# Patient Record
Sex: Male | Born: 1995 | Hispanic: No | Marital: Single | Smoking: Never smoker
Health system: Southern US, Community
[De-identification: ages and names within clinical notes are randomized; demographics above are authoritative.]

## PROBLEM LIST (undated history)

## (undated) DIAGNOSIS — H902 Conductive hearing loss, unspecified: Secondary | ICD-10-CM

## (undated) DIAGNOSIS — F845 Asperger's syndrome: Secondary | ICD-10-CM

## (undated) DIAGNOSIS — F840 Autistic disorder: Secondary | ICD-10-CM

## (undated) HISTORY — DX: Conductive hearing loss, unspecified: H90.2

## (undated) HISTORY — DX: Asperger's syndrome: F84.5

## (undated) HISTORY — DX: Autistic disorder: F84.0

---
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
## 2017-04-01 ENCOUNTER — Other Ambulatory Visit: Payer: Self-pay | Admitting: Family

## 2017-04-01 DIAGNOSIS — F902 Attention-deficit hyperactivity disorder, combined type: Secondary | ICD-10-CM

## 2017-04-01 DIAGNOSIS — F951 Chronic motor or vocal tic disorder: Secondary | ICD-10-CM

## 2017-04-04 ENCOUNTER — Telehealth (INDEPENDENT_AMBULATORY_CARE_PROVIDER_SITE_OTHER): Payer: Self-pay | Admitting: Neurology

## 2017-04-04 NOTE — Telephone Encounter (Signed)
Called patient's mother and left her a voicemail asking her to please return my call for appt scheduling as [name] is overdue to come see us. I sent in a prescription for cloNIDINE for this patient with no refills.

## 2017-04-12 NOTE — Telephone Encounter (Signed)
Patient scheduled for 04/27/2017 at 815am

## 2017-04-27 ENCOUNTER — Ambulatory Visit (INDEPENDENT_AMBULATORY_CARE_PROVIDER_SITE_OTHER): Payer: Self-pay | Admitting: Neurology

## 2017-04-28 NOTE — Progress Notes (Deleted)
[facility]: [name] MRN: [id number] Sex: male DOB: 02/27/1996  Provider: Keturah Shaverseza Nabizadeh, MD [facility]  Note type: Routine return visit  Referral Source: Dr. Vaughan BastaSummer History from: {CN REFERRED OZ:[id number]}BY:[id number]} Chief Complaint: follow up on Tics, Asperger's, ADHD  History of Present Illness:  Adam Drownelson [name] Hauter Jr. is a 21 y.o. male ***.  Review of Systems: 12 system review as per HPI, otherwise negative.  Past Medical History:  Diagnosis Date  . Asperger's disorder 10/21/10  . Autism   . Unspecified conductive hearing loss 12/08/10   Hospitalizations: {yes no:314532}, Head Injury: {yes no:314532}, Nervous System Infections: {yes no:314532}, Immunizations up to date: {yes no:314532}  Birth History ***  Surgical History Past Surgical History:  Procedure Laterality Date  . TOENAIL EXCISION  2013  . TONSILLECTOMY  2003    Family History family history includes ADD / ADHD in his brother and sister; Diabetes in his father, maternal grandfather, maternal grandmother, mother, paternal aunt, paternal aunt, and paternal grandfather; Luiz BlareGraves' disease in his mother; Hyperlipidemia in his maternal grandfather and mother; Migraines in his mother; Other in his cousin. Family History is negative for ***.  Social History Social History   Social History  . Marital status: Single    Spouse name: N/A  . Number of children: N/A  . Years of education: N/A   Social History Main Topics  . Smoking status: Never Smoker  . Smokeless tobacco: Never Used  . Alcohol use No  . Drug use: No  . Sexual activity: No   Other Topics Concern  . Not on file   Social History Narrative   Adam Robbins is in 512 th grade at [employer]. He is doing well.    Living with both parents and brother.   Educational level {Misc; education levels:33222} School Attending: *** {school level:210120006} school. Occupation: Consulting civil engineertudent *** Living with  {companion:315061}  School comments ***  The medication list was reviewed and reconciled. All changes or newly prescribed medications were explained.  A complete medication list was provided to the patient/caregiver.  Allergies  Allergen Reactions  . Other     Seasonal    Physical Exam There were no vitals taken for this visit. ***  Assessment and Plan ***  No orders of the defined types were placed in this encounter.  No orders of the defined types were placed in this encounter.

## 2017-04-29 ENCOUNTER — Encounter (INDEPENDENT_AMBULATORY_CARE_PROVIDER_SITE_OTHER): Payer: Self-pay | Admitting: Neurology

## 2017-04-29 ENCOUNTER — Ambulatory Visit (INDEPENDENT_AMBULATORY_CARE_PROVIDER_SITE_OTHER): Payer: Medicaid Other | Admitting: Neurology

## 2017-04-29 VITALS — BP 108/76 | HR 104 | Ht 67.5 in | Wt 199.6 lb

## 2017-04-29 DIAGNOSIS — F845 Asperger's syndrome: Secondary | ICD-10-CM

## 2017-04-29 DIAGNOSIS — F902 Attention-deficit hyperactivity disorder, combined type: Secondary | ICD-10-CM | POA: Diagnosis not present

## 2017-04-29 DIAGNOSIS — R4587 Impulsiveness: Secondary | ICD-10-CM | POA: Diagnosis not present

## 2017-04-29 DIAGNOSIS — F951 Chronic motor or vocal tic disorder: Secondary | ICD-10-CM | POA: Diagnosis not present

## 2017-04-29 MED ORDER — CLONIDINE HCL 0.1 MG PO TABS: 0.1000 mg | ORAL_TABLET | Freq: Two times a day (BID) | ORAL | 2 refills | Status: DC

## 2017-04-29 NOTE — Progress Notes (Signed)
Patient: Adam Robbins. MRN: [id number] Sex: male DOB: 12/30/1995  Provider: Keturah Shavers, MD Location of Care: [facility]  Note type: Routine return visit  Referral Source: Ronney Asters, MD History from: mother, patient and [facility] chart Chief Complaint: Asperger's disorder, Tics, ADHD  History of Present Illness: Adam Robbins. is a 21 y.o. male is here for follow-up management of tic disorder as well as behavioral issues. He has diagnosis of Asperger disease, ADHD and simple motor and vocal tic disorder for which she has been on moderate dose of clonidine with significant improvement of his symptoms and currently as per patient and his mother he has been having just a couple of episodes of tic movements on a daily basis. As per mother he has been having some anxiety issues as well as occasional aggressive behavior, impulsivity and anger issues for which he was on therapy in the past but he hasn't been on therapy for the past 6 months and currently he does not have any psychologist to go to. He usually sleeps well through the night without any awakening. He is doing well otherwise and has no other complaints or concerns. Mother is happy with his progress with his tic disorder.  Review of Systems: 12 system review as per HPI, otherwise negative.  Past Medical History:  Diagnosis Date  . Asperger's disorder 10/21/10  . Autism   . Unspecified conductive hearing loss 12/08/10   Hospitalizations: No., Head Injury: No., Nervous System Infections: No., Immunizations up to date: Yes.     Surgical History Past Surgical History:  Procedure Laterality Date  . TOENAIL EXCISION  2013  . TONSILLECTOMY  2003    Family History family history includes ADD / ADHD in his brother and sister; Diabetes in his father, maternal grandfather, maternal grandmother, mother, paternal aunt, paternal aunt, and paternal grandfather; Luiz Blare' disease in his mother;  Hyperlipidemia in his maternal grandfather and mother; Migraines in his mother; Other in his cousin.   Social History Social History   Social History  . Marital status: Single    Spouse name: N/A  . Number of children: N/A  . Years of education: N/A   Social History Main Topics  . Smoking status: Never Smoker  . Smokeless tobacco: Never Used  . Alcohol use No  . Drug use: No  . Sexual activity: No   Other Topics Concern  . None   Social History Narrative   Horace is in 12 th grade at [employer]. He is doing well.    Living with both parents and brother.    The medication list was reviewed and reconciled. All changes or newly prescribed medications were explained.  A complete medication list was provided to the patient/caregiver.  Allergies  Allergen Reactions  . Other     Seasonal    Physical Exam BP 108/76   Pulse (!) 104   Ht 5' 7.5" (1.715 m)   Wt 199 lb 9.6 oz (90.5 kg)   BMI 30.80 kg/m  LOV:FIEPP, alert, not in distress Skin:No rash, No neurocutaneous stigmata. HEENT:Normocephalic, no conjunctival injection,  mucous membranes moist, oropharynx clear. Neck:Supple, no meningismus. No focal tenderness. Resp: Clear to auscultation bilaterally IR:JJOACZY rate, normal S1/S2, no murmurs,  Abd: abdomen soft, non-tender, non-distended. No hepatosplenomegaly or mass SAY:TKZS and well-perfused. no muscle wasting,   Neurological Examination: WF:UXNAT, alert, Moderate decrease in eye contact, answered the questions appropriately, speech was fluent, Normal comprehension.  Cranial Nerves:Pupils were equal and reactive  to light ( 5-223mm); normal fundoscopic exam with sharp discs, visual field full with confrontation test; EOM normal, no nystagmus; no ptsosis, no double vision, intact facial sensation, face symmetric with full strength of facial muscles, palate elevation is symmetric, tongue protrusion is symmetric with full movement to both  sides. Sternocleidomastoid and trapezius are with normal strength. Tone-Normal Strength-Normal strength in all muscle groups DTRs-  Biceps Triceps Brachioradialis Patellar Ankle  R 2+ 2+ 2+ 2+ 2+  L 2+ 2+ 2+ 2+ 2+   Plantar responses flexor bilaterally, no clonus noted Sensation:Intact to light touch, Romberg negative. Coordination:No dysmetria on FTN test. No difficulty with balance. Gait:Normal walk and run.     Assessment and Plan 1. Chronic motor or vocal tic disorder   2. Asperger's disorder   3. ADHD (attention deficit hyperactivity disorder), combined type   4. Impulsiveness    This is an 21 year old male with history of Asperger disorder and ADHD as well as chronic motor and vocal tic disorder with significant improvement on moderate dose of clonidine. He is also having anxiety issues as well as aggressive and impulsive behavior and anger outbursts for which she was on therapy in the past but not in the past 6 months. He has no focal findings on his neurological examination. Discussed with mother that at this time I would recommended to continue the same dose of clonidine although if he continues with no frequent motor or vocal tic episodes, he may gradually decreased the dose of medication to 1 tablet every night and see how he does although if he develops more tic episodes, he may need to go back to the previous dose of medication. In terms of his aggressive and impulsive behavior, I think he might need to have some behavioral therapy to work on relaxation techniques so I would recommended to see our behavioral clinician a few sessions to work on these issues. I would like to see him in 6-8 months for follow-up visit or sooner if he develops any more symptoms or any new concerns. He and his mother understood and agreed with the plan.   Meds ordered this encounter  Medications  . cloNIDine (CATAPRES) 0.1 MG tablet    Sig: Take 1 tablet (0.1 mg total)  by mouth 2 (two) times daily.    Dispense:  180 tablet    Refill:  2    Please have mother call to schedule an appt with our office. [phone]   Orders Placed This Encounter  Procedures  . Amb ref to Integrated Behavioral [facility]    Referral Priority:   Routine    Referral Type:   Consultation    Referral Reason:   Specialty Services Required    Number of Visits Requested:   1

## 2017-05-04 DIAGNOSIS — Z0279 Encounter for issue of other medical certificate: Secondary | ICD-10-CM

## 2017-05-13 ENCOUNTER — Institutional Professional Consult (permissible substitution) (INDEPENDENT_AMBULATORY_CARE_PROVIDER_SITE_OTHER): Payer: Medicaid Other | Admitting: Licensed Clinical Social Worker

## 2017-08-05 ENCOUNTER — Encounter: Payer: Self-pay | Admitting: Podiatry

## 2017-08-05 ENCOUNTER — Ambulatory Visit (INDEPENDENT_AMBULATORY_CARE_PROVIDER_SITE_OTHER): Payer: Medicaid Other | Admitting: Podiatry

## 2017-08-05 VITALS — BP 119/68 | HR 76 | Resp 16

## 2017-08-05 DIAGNOSIS — L309 Dermatitis, unspecified: Secondary | ICD-10-CM | POA: Diagnosis not present

## 2017-08-05 NOTE — Progress Notes (Signed)
Subjective:    Patient ID: Adam Driver Leandrew Koyanagi., male   DOB: 21 y.o.   MRN: [id number]   HPI patient presents with crusted tissue formation plantar aspect left that he at times cut Himself    Review of Systems  All other systems reviewed and are negative.       Objective:  Physical Exam  Constitutional: He appears well-developed and well-nourished.  Cardiovascular: Intact distal pulses.   Musculoskeletal: Normal range of motion.  Neurological: He is alert.  Skin: Skin is warm and dry.  Nursing note and vitals reviewed.  Neurovascular status found to be intact muscle strength was adequate with patient noted to have some area of crusted tissue plantar aspect left foot that's localized in nature and not currently tender. Has good digital perfusion well oriented    Assessment:   Most likely is some form of skin condition with no indications that there is bone pathology      Plan:    Reviewed creams that would be best and did H&P and explained treatment options

## 2017-08-05 NOTE — Progress Notes (Signed)
   Subjective:    Patient ID: Adam Drown., male    DOB: 04-01-1996, 21 y.o.   MRN: [id number]  HPI Chief Complaint  Patient presents with  . Skin condition    Left foot; plantar forefoot; pt stated, "Has had this for the past 2 years; has used lotion on skin to try and help with dry skin"      Review of Systems  All other systems reviewed and are negative.      Objective:   Physical Exam        Assessment & Plan:

## 2017-12-09 ENCOUNTER — Encounter (INDEPENDENT_AMBULATORY_CARE_PROVIDER_SITE_OTHER): Payer: Self-pay | Admitting: Neurology

## 2017-12-09 ENCOUNTER — Ambulatory Visit (INDEPENDENT_AMBULATORY_CARE_PROVIDER_SITE_OTHER): Payer: Medicaid Other | Admitting: Neurology

## 2017-12-09 VITALS — BP 110/74 | HR 80 | Ht 67.0 in | Wt 210.4 lb

## 2017-12-09 DIAGNOSIS — F951 Chronic motor or vocal tic disorder: Secondary | ICD-10-CM | POA: Diagnosis not present

## 2017-12-09 DIAGNOSIS — F845 Asperger's syndrome: Secondary | ICD-10-CM | POA: Diagnosis not present

## 2017-12-09 DIAGNOSIS — F902 Attention-deficit hyperactivity disorder, combined type: Secondary | ICD-10-CM | POA: Diagnosis not present

## 2017-12-09 MED ORDER — GUANFACINE HCL ER 2 MG PO TB24: 2.0000 mg | ORAL_TABLET | Freq: Every day | ORAL | 5 refills | Status: DC

## 2017-12-09 NOTE — Progress Notes (Signed)
Patient: Adam Robbins. MRN: [id number] Sex: male DOB: Oct 09, 1996  Provider: Keturah Shavers, MD Location of Care: [facility]  Note type: Routine return visit  Referral Source: Dr. Victorino Dike Summer  History from: patient, [facility] chart and Aunt Chief Complaint: Asperger's, Tics, ADHD  History of Present Illness: Adam Robbins. is a 22 y.o. male is here for follow-up management of tic disorder.  He has been having episodes of motor and vocal tics, most of the time simple but occasionally complex vocal tics for which he has been on clonidine with fairly good symptoms control.  On his last visit in June he was taking 0.1 mg clonidine twice daily and since he was not having frequent symptoms, it was recommended that he might decrease the dose to 1 tablet daily if he continues to be symptom-free. He was doing fairly well for a few months but over the past month he started having more frequent motor and vocal tics which including head turning to the side, making noises and clearing throat and occasionally saying different words loudly.  Currently he is taking 0.1 mg clonidine every night And he does not take the medicine in the morning since he would be more sleepy during the day.  He is not on any other medication at this point.  Review of Systems: 12 system review as per HPI, otherwise negative.  Past Medical History:  Diagnosis Date  . Asperger's disorder 10/21/10  . Autism   . Unspecified conductive hearing loss 12/08/10   Hospitalizations: No., Head Injury: No., Nervous System Infections: No., Immunizations up to date: Yes.    Surgical History Past Surgical History:  Procedure Laterality Date  . TOENAIL EXCISION  2013  . TONSILLECTOMY  2003    Family History family history includes ADD / ADHD in his brother and sister; Diabetes in his father, maternal grandfather, maternal grandmother, mother, paternal aunt, paternal aunt, and paternal grandfather;  Luiz Blare' disease in his mother; Hyperlipidemia in his maternal grandfather and mother; Migraines in his mother; Other in his cousin.   Social History Social History   Socioeconomic History  . Marital status: Single    Spouse name: None  . Number of children: None  . Years of education: None  . Highest education level: None  Social Needs  . Financial resource strain: None  . Food insecurity - worry: None  . Food insecurity - inability: None  . Transportation needs - medical: None  . Transportation needs - non-medical: None  Occupational History  . None  Tobacco Use  . Smoking status: Never Smoker  . Smokeless tobacco: Never Used  Substance and Sexual Activity  . Alcohol use: No  . Drug use: No  . Sexual activity: No    Birth control/protection: Abstinence  Other Topics Concern  . 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    Living with both parents and brother. He enjoys drawing, walking, and cooking.     The medication list was reviewed and reconciled. All changes or newly prescribed medications were explained.  A complete medication list was provided to the patient/caregiver.  Allergies  Allergen Reactions  . Other     Seasonal    Physical Exam BP 110/74   Pulse 80   Ht 5\' 7"  (1.702 m)   Wt 210 lb 6.4 oz (95.4 kg)   BMI 32.95 kg/m  WUJ:WJXBJ, alert, not in distress Skin:No rash, No neurocutaneous stigmata. HEENT:Normocephalic,,  mucous membranes moist, oropharynx clear. Neck:Supple, no meningismus. No focal tenderness. Resp: Clear to auscultation bilaterally FA:OZHYQMVCV:Regular rate, normal S1/S2, no murmurs,  HQI:ONGEXBMAbd:abdomen soft, non-tender, non-distended. No hepatosplenomegaly or mass WUX:LKGMExt:Warm and well-perfused. no muscle wasting,   Neurological Examination: WN:UUVOZS:Awake, alert, Moderate decrease in eye contact, answered the questions appropriately, speech was fluent, Normal comprehension.  Cranial Nerves:Pupils  were equal and reactive to light ( 5-693mm); normal fundoscopic exam with sharp discs, visual field full with confrontation test; EOM normal, no nystagmus; no ptsosis, no double vision, intact facial sensation, face symmetric with full strength of facial muscles, palate elevation is symmetric, tongue protrusion is symmetric with full movement to both sides. Sternocleidomastoid and trapezius are with normal strength. Tone-Normal Strength-Normal strength in all muscle groups DTRs-  Biceps Triceps Brachioradialis Patellar Ankle  R 2+ 2+ 2+ 2+ 2+  L 2+ 2+ 2+ 2+ 2+   Plantar responses flexor bilaterally, no clonus noted Sensation:Intact to light touch, Romberg negative. Coordination:No dysmetria on FTN test. No difficulty with balance. Gait:Normal walk and run.    Assessment and Plan 1. Chronic motor or vocal tic disorder   2. Asperger's disorder   3. ADHD (attention deficit hyperactivity disorder), combined type    This is a 22 year old male with history of Asperger disorder and ADHD who has been having chronic motor and vocal tics both simple and complex, has been on clonidine with fairly good control but recently is having more frequent symptoms.  He has no focal findings on his neurological examination and otherwise doing well with no other behavioral issues and usually sleeps well through the night. I recommend to switch his medication to Intuniv 2 mg which would be a longer acting clonidine and may work better for 24 hours.  Recommend to take 1 tablet every night a couple of hours before sleep. I recommend to take this medication at least for 1 month and see how he does and then mother will call me if he is having any side effects or the medication is not working well so either we could adjust the dose of medication or we may go back to the previous dose of clonidine. He and his aunt understood and agreed with the plan.   Meds ordered this encounter  Medications   . guanFACINE (INTUNIV) 2 MG TB24 ER tablet    Sig: Take 1 tablet (2 mg total) by mouth at bedtime. Take it 2 hours before sleep    Dispense:  30 tablet    Refill:  5

## 2017-12-09 NOTE — Patient Instructions (Signed)
We will start Intuniv 2 mg to take every night 2 hours before sleep which will be replaced clonidine. Call me in 1 month to see how he does and if there is any need to change the dosage of medication or go back to the previous medication clonidine. Return in 6 months or sooner if he develops more frequent symptoms.

## 2017-12-14 ENCOUNTER — Telehealth (INDEPENDENT_AMBULATORY_CARE_PROVIDER_SITE_OTHER): Payer: Self-pay | Admitting: Neurology

## 2017-12-14 NOTE — Telephone Encounter (Signed)
The new letter was signed, please let father know.

## 2017-12-14 NOTE — Telephone Encounter (Signed)
Spoke with dad and he stated that the previous letter did not state that patient also had autism as well. He was wondering if that could be added. He stated they would be picking the letter up, I informed him that I would let him know when it was ready.

## 2017-12-14 NOTE — Telephone Encounter (Signed)
Who's calling (name and relationship to patient) : [name] (mom)  Best contact number: [phone] or [phone]  Provider they see: Devonne DoughtyNabizadeh   Reason for call: Dad called and stated that the need a letter by 1:00-1:30 today, stating that the patient has Autism.  Letter received did not state the patient had Autism  Please call for details.        PRESCRIPTION REFILL ONLY  Name of prescription:  Pharmacy:

## 2017-12-15 NOTE — Telephone Encounter (Signed)
Spoke with mother and let her know that the letter was ready to be picked up, she stated she would be here between 1-2 pm to get it.

## 2017-12-22 ENCOUNTER — Telehealth (INDEPENDENT_AMBULATORY_CARE_PROVIDER_SITE_OTHER): Payer: Self-pay | Admitting: Neurology

## 2017-12-22 NOTE — Telephone Encounter (Signed)
°  Who's calling (name and relationship to patient) : Shona SimpsonMary Josephine (Mom) Best contact number: [phone] Provider they see: Dr. Devonne DoughtyNabizadeh Reason for call: Mom requested that the letter be mailed to her. She also requested two additional copies of the letter.

## 2018-01-30 ENCOUNTER — Other Ambulatory Visit: Payer: Self-pay

## 2018-01-30 ENCOUNTER — Encounter (HOSPITAL_BASED_OUTPATIENT_CLINIC_OR_DEPARTMENT_OTHER): Payer: Self-pay | Admitting: *Deleted

## 2018-01-30 ENCOUNTER — Emergency Department (HOSPITAL_BASED_OUTPATIENT_CLINIC_OR_DEPARTMENT_OTHER): Payer: Medicaid Other

## 2018-01-30 ENCOUNTER — Emergency Department (HOSPITAL_BASED_OUTPATIENT_CLINIC_OR_DEPARTMENT_OTHER)
Admission: EM | Admit: 2018-01-30 | Discharge: 2018-01-30 | Disposition: A | Discharge status: Home / Self Care | Payer: Medicaid Other | Attending: Emergency Medicine | Admitting: Emergency Medicine

## 2018-01-30 DIAGNOSIS — R1033 Periumbilical pain: Secondary | ICD-10-CM | POA: Diagnosis not present

## 2018-01-30 DIAGNOSIS — Z79899 Other long term (current) drug therapy: Secondary | ICD-10-CM | POA: Insufficient documentation

## 2018-01-30 DIAGNOSIS — F845 Asperger's syndrome: Secondary | ICD-10-CM | POA: Diagnosis not present

## 2018-01-30 DIAGNOSIS — R109 Unspecified abdominal pain: Secondary | ICD-10-CM | POA: Diagnosis present

## 2018-01-30 LAB — URINALYSIS, ROUTINE W REFLEX MICROSCOPIC
Bilirubin Urine: NEGATIVE
GLUCOSE, UA: NEGATIVE mg/dL
HGB URINE DIPSTICK: NEGATIVE
Ketones, ur: NEGATIVE mg/dL
Leukocytes, UA: NEGATIVE
Nitrite: NEGATIVE
PH: 6 (ref 5.0–8.0)
Protein, ur: NEGATIVE mg/dL
SPECIFIC GRAVITY, URINE: 1.025 (ref 1.005–1.030)

## 2018-01-30 LAB — COMPREHENSIVE METABOLIC PANEL
ALK PHOS: 64 U/L (ref 38–126)
ALT: 24 U/L (ref 17–63)
AST: 22 U/L (ref 15–41)
Albumin: 3.7 g/dL (ref 3.5–5.0)
Anion gap: 7 (ref 5–15)
BILIRUBIN TOTAL: 0.5 mg/dL (ref 0.3–1.2)
BUN: 10 mg/dL (ref 6–20)
CALCIUM: 8.6 mg/dL — AB (ref 8.9–10.3)
CO2: 24 mmol/L (ref 22–32)
Chloride: 109 mmol/L (ref 101–111)
Creatinine, Ser: 0.89 mg/dL (ref 0.61–1.24)
Glucose, Bld: 104 mg/dL — ABNORMAL HIGH (ref 65–99)
Potassium: 3.8 mmol/L (ref 3.5–5.1)
Sodium: 140 mmol/L (ref 135–145)
Total Protein: 6.5 g/dL (ref 6.5–8.1)

## 2018-01-30 NOTE — ED Provider Notes (Signed)
[facility] EMERGENCY DEPARTMENT Provider Note   CSN: [id number] Arrival date & time: 01/30/18  0307     History   Chief Complaint Chief Complaint  Patient presents with  . Abdominal Pain    HPI Adam HospitalNelson [name] Leandrew KoyanagiCamacho Jr. is a 22 y.o. male.  HPI  This is a 22 year old male with a history of Asperger syndrome who presents with abdominal pain.  Patient reports onset of sharp.  Umbilical and lower abdominal pain that started at 1 AM.  Denies nausea, vomiting, or diarrhea.  Pain comes and goes.  At times it has radiated into his groin.  Denies hematuria or dysuria.  Currently rates his pain at 3 out of 10.  He did take ibuprofen prior to being evaluated.  Mother questions lactose intolerance as patient has had similar symptoms before with pizza and last night he ate macaroni and cheese.  No history of kidney stones.  Past Medical History:  Diagnosis Date  . Asperger's disorder 10/21/10  . Autism   . 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 Asperger's disorder 02/20/2015  . ADHD (attention deficit hyperactivity disorder), combined type 02/20/2015  . Behavior problems 02/20/2015  . Transient tic disorder 08/30/2013  . Episodic tension type headache 08/30/2013  . 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 TOENAIL EXCISION  2013  . TONSILLECTOMY  2003       Home Medications    Prior to Admission medications   Medication Sig Start Date End Date Taking? Authorizing Provider  cloNIDine (CATAPRES) 0.1 MG tablet Take 1 tablet (0.1 mg total) by mouth 2 (two) times daily. 04/29/17   Keturah ShaversNabizadeh, Reza, MD  guanFACINE (INTUNIV) 2 MG TB24 ER tablet Take 1 tablet (2 mg total) by mouth at bedtime. Take it 2 hours before sleep 12/09/17   Keturah ShaversNabizadeh, Reza, MD  levocetirizine  (XYZAL) 5 MG tablet Take 5 mg by mouth as needed.     [provider]    Family History Family History  Problem Relation Age of Onset  . Diabetes Mother   . Hyperlipidemia Mother   . Graves' disease Mother   . Migraines Mother   . Diabetes Father   . Diabetes Maternal Grandmother   . Diabetes Maternal Grandfather   . Hyperlipidemia Maternal Grandfather   . Diabetes Paternal Grandfather   . ADD / ADHD Sister   . ADD / ADHD Brother   . Diabetes Paternal Aunt   . Diabetes Paternal Aunt   . Other Cousin        Tourette's Syndrome    Social History Social History   Tobacco Use  . Smoking status: Never Smoker  . Smokeless tobacco: Never Used  Substance Use Topics  . Alcohol use: No  . Drug use: No     Allergies   Other   Review of Systems Review of Systems  Constitutional: Negative for fever.  Respiratory: Negative for shortness of breath.   Cardiovascular: Negative for chest pain.  Gastrointestinal: Positive for abdominal pain. Negative for diarrhea, nausea and vomiting.  Genitourinary: Negative for dysuria and hematuria.  All other systems reviewed and are negative.    Physical Exam Updated Vital Signs BP (!) 93/50 (BP Location: Right Arm)   Pulse 62   Temp 98.1 F (36.7 C) (Oral)   Resp  18   Ht 5\' 7"  (1.702 m)   Wt 44.6 kg (98 lb 5 oz)   SpO2 99%   BMI 15.40 kg/m   Physical Exam  Constitutional: He is oriented to person, place, and time. He appears well-developed and well-nourished.  No acute distress  HENT:  Head: Normocephalic and atraumatic.  Neck: Neck supple.  Cardiovascular: Normal rate, regular rhythm and normal heart sounds.  No murmur heard. Pulmonary/Chest: Effort normal and breath sounds normal. No respiratory distress. He has no wheezes.  Abdominal: Soft. Bowel sounds are normal. There is no tenderness. There is no rebound and no guarding.  Genitourinary: Penis normal.  Genitourinary Comments: Normal uncircumcised penis,  normal testicular lie, no overlying skin changes, no mass  Musculoskeletal: He exhibits no edema.  Lymphadenopathy:    He has no cervical adenopathy.  Neurological: He is alert and oriented to person, place, and time.  Skin: Skin is warm and dry.  Psychiatric: He has a normal mood and affect.  Nursing note and vitals reviewed.    ED Treatments / Results  Labs (all labs ordered are listed, but only abnormal results are displayed) Labs Reviewed  COMPREHENSIVE METABOLIC PANEL - Abnormal; Notable for the following components:      Result Value   Glucose, Bld 104 (*)    Calcium 8.6 (*)    All other components within normal limits  URINALYSIS, ROUTINE W REFLEX MICROSCOPIC    EKG  EKG Interpretation None       Radiology Ct Renal Stone Study  Result Date: 01/30/2018 CLINICAL DATA:  Acute onset of right lower quadrant abdominal pain. EXAM: CT ABDOMEN AND PELVIS WITHOUT CONTRAST TECHNIQUE: Multidetector CT imaging of the abdomen and pelvis was performed following the standard protocol without IV contrast. COMPARISON:  Abdominal radiograph performed 04/13/2005 FINDINGS: Lower chest: The visualized lung bases are grossly clear. The visualized portions of the mediastinum are unremarkable. Hepatobiliary: The liver is unremarkable in appearance. The gallbladder is unremarkable in appearance. The common bile duct remains normal in caliber. Pancreas: The pancreas is within normal limits. Spleen: The spleen is unremarkable in appearance. Adrenals/Urinary Tract: The adrenal glands are unremarkable in appearance. The kidneys are within normal limits. There is no evidence of hydronephrosis. No renal or ureteral stones are identified. No perinephric stranding is seen. Stomach/Bowel: The stomach is unremarkable in appearance. The small bowel is within normal limits. The appendix is normal in caliber, without evidence of appendicitis. The colon is unremarkable in appearance. Vascular/Lymphatic: The  abdominal aorta is unremarkable in appearance. The inferior vena cava is grossly unremarkable. No retroperitoneal lymphadenopathy is seen. No pelvic sidewall lymphadenopathy is identified. Reproductive: The bladder is mildly distended and grossly unremarkable. The prostate remains normal in size. Other: No additional soft tissue abnormalities are seen. Musculoskeletal: No acute osseous abnormalities are identified. The visualized musculature is unremarkable in appearance. IMPRESSION: Unremarkable noncontrast CT of the abdomen and pelvis. Electronically Signed   By: Roanna Raider M.D.   On: 01/30/2018 05:33    Procedures Procedures (including critical care time)  Medications Ordered in ED Medications - No data to display   Initial Impression / Assessment and Plan / ED Course  I have reviewed the triage vital signs and the nursing notes.  Pertinent labs & imaging results that were available during my care of the patient were reviewed by me and considered in my medical decision making (see chart for details).     She presents with abdominal pain.  Acute in onset.  Waxes and  wanes and radiates sometimes into the groin.  He is overall nontoxic appearing and vital signs reassuring.  No reproducible tenderness on exam.  No masses to suggest hernia.  Denies obstructive symptoms.  Kidney stone is a consideration.  Urinalysis without abnormality.  CMP is reassuring.  CT scan without contrast obtained and is largely unremarkable.  On recheck, patient states he feels better and wants to go home.  He remains nontender.  Unclear etiology of pain.  Mother is requesting GI referral.  This was provided to her.  Recommend supportive measures.  After history, exam, and medical workup I feel the patient has been appropriately medically screened and is safe for discharge home. Pertinent diagnoses were discussed with the patient. Patient was given return precautions.   Final Clinical Impressions(s) / ED Diagnoses     Final diagnoses:  Periumbilical abdominal pain    ED Discharge Orders    None       Shon Baton, MD 01/30/18 [phone]

## 2018-01-30 NOTE — ED Notes (Signed)
Pt and mother given d/c instructions as per chart. Verbalizes understanding. No questions. 

## 2018-01-30 NOTE — Discharge Instructions (Signed)
Seen today for abdominal pain.  The cause of your pain at this time is unknown.  Follow-up with your primary physician.  You were provided with GI follow-up as requested (Dr. Myrtie Neitheranis).

## 2018-01-30 NOTE — ED Notes (Signed)
Patient transported to CT 

## 2018-01-30 NOTE — ED Notes (Signed)
ED Provider at bedside. 

## 2018-01-30 NOTE — ED Notes (Signed)
Pt is autistic. Parents state he has been very gassy lately. Took Gas-X PTA to ED. Drank carbonated water. Has eaten a lot of fried foods recently. Abd soft. Tender LUQ and RLQ, but pt has problems distinguishing which is worse. Last BM yesterday. "Normal." Denies urinary s/s or other complaints.

## 2018-01-30 NOTE — ED Triage Notes (Addendum)
Pt c/o lower  abd pain that started around 1am. Describes pain as sharp and stabbing. States pain comes and goes. Denies any urinary symptoms but did state at one point tonight the pain radiated into his penis. Has taken 2 ibuprofen pta. Denies any n/v/d. Last BM yesterday. States pain worse if he bends over.

## 2018-02-24 ENCOUNTER — Emergency Department (HOSPITAL_BASED_OUTPATIENT_CLINIC_OR_DEPARTMENT_OTHER): Payer: Medicaid Other

## 2018-02-24 ENCOUNTER — Encounter (HOSPITAL_BASED_OUTPATIENT_CLINIC_OR_DEPARTMENT_OTHER): Payer: Self-pay | Admitting: *Deleted

## 2018-02-24 ENCOUNTER — Emergency Department (HOSPITAL_BASED_OUTPATIENT_CLINIC_OR_DEPARTMENT_OTHER)
Admission: EM | Admit: 2018-02-24 | Discharge: 2018-02-25 | Disposition: A | Discharge status: Home / Self Care | Payer: Medicaid Other | Attending: Emergency Medicine | Admitting: Emergency Medicine

## 2018-02-24 DIAGNOSIS — F845 Asperger's syndrome: Secondary | ICD-10-CM | POA: Diagnosis not present

## 2018-02-24 DIAGNOSIS — Z79899 Other long term (current) drug therapy: Secondary | ICD-10-CM | POA: Insufficient documentation

## 2018-02-24 DIAGNOSIS — F902 Attention-deficit hyperactivity disorder, combined type: Secondary | ICD-10-CM | POA: Insufficient documentation

## 2018-02-24 DIAGNOSIS — R072 Precordial pain: Secondary | ICD-10-CM | POA: Diagnosis not present

## 2018-02-24 NOTE — ED Triage Notes (Signed)
Pt reports midline chest pain/upper abdominal pain when he takes a deep breath, coughs, yawning or sneezes that started today

## 2018-02-25 ENCOUNTER — Other Ambulatory Visit: Payer: Self-pay

## 2018-02-25 ENCOUNTER — Encounter (HOSPITAL_BASED_OUTPATIENT_CLINIC_OR_DEPARTMENT_OTHER): Payer: Self-pay | Admitting: Emergency Medicine

## 2018-02-25 LAB — CBC
HCT: 48.1 % (ref 39.0–52.0)
Hemoglobin: 16.4 g/dL (ref 13.0–17.0)
MCH: 29.1 pg (ref 26.0–34.0)
MCHC: 34.1 g/dL (ref 30.0–36.0)
MCV: 85.4 fL (ref 78.0–100.0)
PLATELETS: 210 10*3/uL (ref 150–400)
RBC: 5.63 MIL/uL (ref 4.22–5.81)
RDW: 13.4 % (ref 11.5–15.5)
WBC: 10.7 10*3/uL — AB (ref 4.0–10.5)

## 2018-02-25 LAB — BASIC METABOLIC PANEL
ANION GAP: 9 (ref 5–15)
BUN: 12 mg/dL (ref 6–20)
CALCIUM: 8.9 mg/dL (ref 8.9–10.3)
CHLORIDE: 107 mmol/L (ref 101–111)
CO2: 23 mmol/L (ref 22–32)
Creatinine, Ser: 1 mg/dL (ref 0.61–1.24)
Glucose, Bld: 97 mg/dL (ref 65–99)
POTASSIUM: 3.7 mmol/L (ref 3.5–5.1)
Sodium: 139 mmol/L (ref 135–145)

## 2018-02-25 LAB — TROPONIN I: Troponin I: <0.03 ng/mL (ref <0.03)

## 2018-02-25 MED ORDER — KETOROLAC TROMETHAMINE 30 MG/ML IJ SOLN
15.0000 mg | Freq: Once | INTRAMUSCULAR | Status: AC
  Administered 2018-02-25: 15 mg via INTRAVENOUS
  Filled 2018-02-25: qty 1

## 2018-02-25 MED ORDER — ALUM & MAG HYDROXIDE-SIMETH 200-200-20 MG/5ML PO SUSP
30.0000 mL | Freq: Once | ORAL | Status: AC
  Administered 2018-02-25: 30 mL via ORAL
  Filled 2018-02-25: qty 30

## 2018-02-25 NOTE — ED Provider Notes (Signed)
[facility] EMERGENCY DEPARTMENT Provider Note   CSN: [id number] Arrival date & time: 02/24/18  2257     History   Chief Complaint Chief Complaint  Patient presents with  . Chest Pain    HPI Adam Robbins. is a 22 y.o. male.  The history is provided by the patient and a parent.  Chest Pain   This is a new problem. The current episode started 3 to 5 hours ago. The problem occurs constantly. The problem has not changed since onset.The pain is associated with rest. The pain is present in the substernal region. The pain is moderate. The quality of the pain is described as dull. Radiates to: to his feet. Exacerbated by: worse with sneezing. Pertinent negatives include no cough, no diaphoresis, no dizziness, no hemoptysis, no leg pain, no lower extremity edema, no near-syncope, no orthopnea, no palpitations, no PND, no shortness of breath and no sputum production.    Past Medical History:  Diagnosis Date  . Asperger's disorder 10/21/10  . Autism   . 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 Asperger's disorder 02/20/2015  . ADHD (attention deficit hyperactivity disorder), combined type 02/20/2015  . Behavior problems 02/20/2015  . Transient tic disorder 08/30/2013  . Episodic tension type headache 08/30/2013  . 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 TOENAIL EXCISION  2013  . TONSILLECTOMY  2003        Home Medications    Prior to Admission medications   Medication Sig Start Date End Date Taking? Authorizing Provider  guanFACINE (INTUNIV) 2 MG TB24 ER tablet Take 1 tablet (2 mg total) by mouth at bedtime. Take it 2 hours before sleep 12/09/17  Yes Keturah Shavers, MD  levocetirizine (XYZAL) 5 MG tablet Take 5 mg by mouth as needed.    Yes  [provider]  cloNIDine (CATAPRES) 0.1 MG tablet Take 1 tablet (0.1 mg total) by mouth 2 (two) times daily. 04/29/17   Keturah Shavers, MD    Family History Family History  Problem Relation Age of Onset  . Diabetes Mother   . Hyperlipidemia Mother   . Graves' disease Mother   . Migraines Mother   . Diabetes Father   . Diabetes Maternal Grandmother   . Diabetes Maternal Grandfather   . Hyperlipidemia Maternal Grandfather   . Diabetes Paternal Grandfather   . ADD / ADHD Sister   . ADD / ADHD Brother   . Diabetes Paternal Aunt   . Diabetes Paternal Aunt   . Other Cousin        Tourette's Syndrome    Social History Social History   Tobacco Use  . Smoking status: Never Smoker  . Smokeless tobacco: Never Used  Substance Use Topics  . Alcohol use: No  . Drug use: No     Allergies   Other   Review of Systems Review of Systems  Constitutional: Negative for diaphoresis.  Respiratory: Negative for cough, hemoptysis, sputum production and shortness of breath.   Cardiovascular: Positive for chest pain. Negative for palpitations, orthopnea, leg swelling, PND and near-syncope.  Neurological: Negative for dizziness.  All other systems reviewed and are negative.    Physical Exam Updated Vital Signs BP 108/61   Pulse 64   Temp 98.3 F (36.8 C) (Oral)  Resp 18   Ht 5\' 7"  (1.702 m)   Wt 44.5 kg (98 lb)   SpO2 99%   BMI 15.35 kg/m   Physical Exam  Constitutional: He is oriented to person, place, and time. He appears well-developed and well-nourished. No distress.  HENT:  Head: Normocephalic and atraumatic.  Nose: Nose normal.  Mouth/Throat: No oropharyngeal exudate.  Eyes: Pupils are equal, round, and reactive to light. Conjunctivae are normal.  Neck: Normal range of motion. Neck supple.  Cardiovascular: Normal rate, regular rhythm, normal heart sounds and intact distal pulses.  Pulmonary/Chest: Effort normal and breath sounds normal. No stridor. He has  no wheezes. He has no rales.  Abdominal: Soft. Bowel sounds are normal. He exhibits no mass. There is no tenderness. There is no rebound and no guarding.  Musculoskeletal: Normal range of motion.  Neurological: He is alert and oriented to person, place, and time. He displays normal reflexes.  Skin: Skin is warm and dry. Capillary refill takes less than 2 seconds.  Psychiatric: He has a normal mood and affect.     ED Treatments / Results  Labs (all labs ordered are listed, but only abnormal results are displayed)  Results for orders placed or performed during the hospital encounter of 02/24/18  Basic metabolic panel  Result Value Ref Range   Sodium 139 135 - 145 mmol/L   Potassium 3.7 3.5 - 5.1 mmol/L   Chloride 107 101 - 111 mmol/L   CO2 23 22 - 32 mmol/L   Glucose, Bld 97 65 - 99 mg/dL   BUN 12 6 - 20 mg/dL   Creatinine, Ser 4.091.00 0.61 - 1.24 mg/dL   Calcium 8.9 8.9 - 81.110.3 mg/dL   GFR calc non Af Amer >60 >60 mL/min   GFR calc Af Amer >60 >60 mL/min   Anion gap 9 5 - 15  CBC  Result Value Ref Range   WBC 10.7 (H) 4.0 - 10.5 K/uL   RBC 5.63 4.22 - 5.81 MIL/uL   Hemoglobin 16.4 13.0 - 17.0 g/dL   HCT 91.448.1 78.239.0 - 95.652.0 %   MCV 85.4 78.0 - 100.0 fL   MCH 29.1 26.0 - 34.0 pg   MCHC 34.1 30.0 - 36.0 g/dL   RDW 21.313.4 08.611.5 - 57.815.5 %   Platelets 210 150 - 400 K/uL  Troponin I  Result Value Ref Range   Troponin I <0.03 <0.03 ng/mL   Dg Chest 2 View  Result Date: 02/25/2018 CLINICAL DATA:  Chest pain tonight.  Nonsmoker. EXAM: CHEST - 2 VIEW COMPARISON:  None. FINDINGS: The heart size and mediastinal contours are within normal limits. Both lungs are clear. The visualized skeletal structures are unremarkable. IMPRESSION: No active cardiopulmonary disease. Electronically Signed   By: Burman NievesWilliam  Stevens M.D.   On: 02/25/2018 00:39   Ct Renal Stone Study  Result Date: 01/30/2018 CLINICAL DATA:  Acute onset of right lower quadrant abdominal pain. EXAM: CT ABDOMEN AND PELVIS WITHOUT CONTRAST  TECHNIQUE: Multidetector CT imaging of the abdomen and pelvis was performed following the standard protocol without IV contrast. COMPARISON:  Abdominal radiograph performed 04/13/2005 FINDINGS: Lower chest: The visualized lung bases are grossly clear. The visualized portions of the mediastinum are unremarkable. Hepatobiliary: The liver is unremarkable in appearance. The gallbladder is unremarkable in appearance. The common bile duct remains normal in caliber. Pancreas: The pancreas is within normal limits. Spleen: The spleen is unremarkable in appearance. Adrenals/Urinary Tract: The adrenal glands are unremarkable in appearance. The kidneys are within normal limits.  There is no evidence of hydronephrosis. No renal or ureteral stones are identified. No perinephric stranding is seen. Stomach/Bowel: The stomach is unremarkable in appearance. The small bowel is within normal limits. The appendix is normal in caliber, without evidence of appendicitis. The colon is unremarkable in appearance. Vascular/Lymphatic: The abdominal aorta is unremarkable in appearance. The inferior vena cava is grossly unremarkable. No retroperitoneal lymphadenopathy is seen. No pelvic sidewall lymphadenopathy is identified. Reproductive: The bladder is mildly distended and grossly unremarkable. The prostate remains normal in size. Other: No additional soft tissue abnormalities are seen. Musculoskeletal: No acute osseous abnormalities are identified. The visualized musculature is unremarkable in appearance. IMPRESSION: Unremarkable noncontrast CT of the abdomen and pelvis. Electronically Signed   By: Roanna Raider M.D.   On: 01/30/2018 05:33    EKG EKG Interpretation  Date/Time:  Saturday Verneice Caspers 06 2019 00:13:14 EDT Ventricular Rate:  61 PR Interval:    QRS Duration: 101 QT Interval:  399 QTC Calculation: 402 R Axis:   37 Text Interpretation:  Sinus rhythm Confirmed by Elsa Ploch ([id number]) on 02/25/2018 12:50:04  AM   Radiology Dg Chest 2 View  Result Date: 02/25/2018 CLINICAL DATA:  Chest pain tonight.  Nonsmoker. EXAM: CHEST - 2 VIEW COMPARISON:  None. FINDINGS: The heart size and mediastinal contours are within normal limits. Both lungs are clear. The visualized skeletal structures are unremarkable. IMPRESSION: No active cardiopulmonary disease. Electronically Signed   By: Burman Nieves M.D.   On: 02/25/2018 00:39    Procedures Procedures (including critical care time)  Medications Ordered in ED Medications  alum & mag hydroxide-simeth (MAALOX/MYLANTA) 200-200-20 MG/5ML suspension 30 mL (30 mLs Oral Given 02/25/18 0020)  ketorolac (TORADOL) 30 MG/ML injection 15 mg (15 mg Intravenous Given 02/25/18 0020)     PERC negative wells 0 highly doubt PE in this very low risk patient.  Highly doubt ACS.  HEART score is 1  Final Clinical Impressions(s) / ED Diagnoses   Final diagnoses:  Precordial pain    Return for weakness, numbness, changes in vision or speech, fevers >100.4 unrelieved by medication, shortness of breath, intractable vomiting, or diarrhea, abdominal pain, Inability to tolerate liquids or food, cough, altered mental status or any concerns. No signs of systemic illness or infection. The patient is nontoxic-appearing on exam and vital signs are within normal limits.   I have reviewed the triage vital signs and the nursing notes. Pertinent labs &imaging results that were available during my care of the patient were reviewed by me and considered in my medical decision making (see chart for details).  After history, exam, and medical workup I feel the patient has been appropriately medically screened and is safe for discharge home. Pertinent diagnoses were discussed with the patient. Patient was given return precautions.    Ivana Nicastro, MD 02/25/18 [phone]

## 2018-02-25 NOTE — ED Notes (Signed)
Blue and dark green top labs sent to lab if need it.

## 2018-04-14 ENCOUNTER — Telehealth (INDEPENDENT_AMBULATORY_CARE_PROVIDER_SITE_OTHER): Payer: Self-pay | Admitting: Pediatrics

## 2018-04-14 NOTE — Telephone Encounter (Signed)
°  Who's calling (name and relationship to patient) : Lianne Cure (Mother) Best contact number: [phone] Provider they see: Dr. Sharene Skeans  Reason for call: Mom lvm stating that pt has not been able to sleep and suspects it may be due to Guanfacine rx.  Mom would like a call back regarding medication.

## 2018-04-14 NOTE — Telephone Encounter (Signed)
Spoke with mom to get more information. She stated that he has been having the sleep issues for a bout a month. She states that his anxiety and OCD has been really bad. She states that one night she gave him Melatonin and it helped him. She does not want to take him off the Guanfacine but she does not know what else to do. Please advise.

## 2018-04-14 NOTE — Telephone Encounter (Signed)
This is a patient of Dr. Devonne Doughty.  Medication is being given both for sleep and for insomnia.  Given that mother believes that is keeping him awake (which I think is unlikely) I recommended that she hold the medicine tonight and give guanfacine tomorrow.  It makes him sleepy during the day, then she should return to nighttime treatment.  I told her that Dr. Devonne Doughty would be back on Tuesday afternoon.

## 2018-04-18 NOTE — Telephone Encounter (Signed)
Talked to mother, she mentioned that now he is doing better in terms of sleep at night with melatonin and he is taking the guanfacine in the morning although he is a still having some OCD-like symptoms that is worse than before. I discussed with mother that in terms of guanfacine, I think he needs to continue the same dose and it would be okay to take it in the morning and if he is sleeping better at night with melatonin just continue that for now. In terms of his behavior he needs to be seen by psychologist and behavioral service and since he is close to 22, he needs to have a new PCP and then get a referral from PCP to see adult behavioral medicine for these behavior.  Mother will look to find a PCP as soon as possible.

## 2018-04-20 ENCOUNTER — Other Ambulatory Visit: Payer: Self-pay | Admitting: Gastroenterology

## 2018-04-20 DIAGNOSIS — R1013 Epigastric pain: Secondary | ICD-10-CM

## 2018-04-24 ENCOUNTER — Other Ambulatory Visit: Payer: Medicaid Other

## 2018-05-22 ENCOUNTER — Telehealth (INDEPENDENT_AMBULATORY_CARE_PROVIDER_SITE_OTHER): Payer: Self-pay | Admitting: Neurology

## 2018-05-22 NOTE — Telephone Encounter (Signed)
I spoke to [facility] (mom) and she states that she has not seen any improvements in his TICS and he has had issues with his moods. They did the medication during the day instead of before bed and haven't seen any changes. Mom wants to begin weaning him off of the guanfacine to see if that's what's causing his issues. He is also scheduled to see behavioral health. Mom just wants to know how to wean him off of this medication and what's her next steps. I let her know that I would get this info to Dr. Devonne DoughtyNabizadeh and get back with her asap.

## 2018-05-22 NOTE — Telephone Encounter (Signed)
°  Who's calling (name and relationship to patient) : Mary (Mother) Best contact number: [phone] Provider they see: Dr. Devonne DoughtyNabizadeh  Reason for call: Mom stated that she has not seen any changes in side effects/behavior since pt's Guanfacine was adjusted. Mom would like to speak with Dr. Merri BrunetteNab about having pt taken off of medication. Please advise.

## 2018-05-22 NOTE — Telephone Encounter (Signed)
Mother thinks that the behavior could be related to the medication although there is no significant relation between starting to behavior problems and medication.  He is going to see behavioral specialist in a couple of days so I recommended to continue the medication as it is and then talk to the behavioral specialist and then decide what would be the best way to go but if mother would like to stop it he can take the medicine every other day for 1 week and then discontinue the Intuniv.  Mother understood and agreed.

## 2018-05-24 ENCOUNTER — Encounter (HOSPITAL_COMMUNITY): Payer: Self-pay | Admitting: Licensed Clinical Social Worker

## 2018-05-24 ENCOUNTER — Ambulatory Visit (INDEPENDENT_AMBULATORY_CARE_PROVIDER_SITE_OTHER): Payer: Medicaid Other | Admitting: Licensed Clinical Social Worker

## 2018-05-24 DIAGNOSIS — F840 Autistic disorder: Secondary | ICD-10-CM | POA: Diagnosis not present

## 2018-05-24 DIAGNOSIS — R4689 Other symptoms and signs involving appearance and behavior: Secondary | ICD-10-CM | POA: Diagnosis not present

## 2018-05-24 DIAGNOSIS — F951 Chronic motor or vocal tic disorder: Secondary | ICD-10-CM | POA: Diagnosis not present

## 2018-05-24 NOTE — Progress Notes (Signed)
Comprehensive Clinical Assessment (CCA) Note  05/24/2018 [facility] [place]. [id number]  Visit Diagnosis:      ICD-10-CM   1. Autism spectrum disorder F84.0   2. Compulsive behavior R46.89   3. Chronic motor or vocal tic disorder F95.1       CCA Part One  Part One has been completed on paper by the patient.  (See scanned document in Chart Review)  CCA Part Two A  Intake/Chief Complaint:  CCA Intake With Chief Complaint CCA Part Two Date: 05/24/18 CCA Part Two Time: 1310 Chief Complaint/Presenting Problem: Over the past couple months patient has been demonstrating some concerning behavior.  He has times when he engages in compulsive behavior (opening and closing doors/drawers, flicking the lights on and off, etc).  He doesn't want to be doing those things but he can't seem to stop.  It can be time consuming.  Recounted one time when he engaged in a repetitive act in the middle of the night for about 2 hours.   Patients Currently Reported Symptoms/Problems: He has hit himself in an attempt to get rid of obsessive thoughts or images.  Sometimes yells at himself  to stop and yet he continues the repetitive behavior  Sometimes gets so overwhelmed that he "shuts down," doesn't want anyone near him, and needs to do something calming like listen to music or lay in his bed.  He hasn't been sleeping well.  Can't seem to get comfortable.  Often takes 1-2 hours to fall asleep.   Collateral Involvement: Patient's dad, Tajee Savant Sr provided information during half of the assessment Individual's Strengths: Likes to draw  Good relationship with his family  Motivated to further his education Type of Services Patient Feels Are Needed: Not sure Initial Clinical Notes/Concerns: Patient was diagnosed with autism spectrum disorder around age 22.  He also has a history of tics.  Dad noted tics have increased in the past month or two.  Patient sees a neurologist named Dr. Devonne Doughty     Mental Health  Symptoms Depression:  Depression: N/A  Mania:  Mania: N/A  Anxiety:   Anxiety: N/A  Psychosis:  Psychosis: N/A  Trauma:  Trauma: N/A  Obsessions:  Obsessions: Recurrent & persistent thoughts/impulses/images, Intrusive/time consuming, Disrupts routine/functioning  Compulsions:  Compulsions: Disrupts with routine/functioning, "Driven" to perform behaviors/acts, Intrusive/time consuming, Not connected to stressor, Repeated behaviors/mental acts  Inattention:     Hyperactivity/Impulsivity:     Oppositional/Defiant Behaviors:     Borderline Personality:  Emotional Irregularity: N/A  Other Mood/Personality Symptoms:      Mental Status Exam Appearance and self-care  Stature:  Stature: Average  Weight:  Weight: Overweight  Clothing:  Clothing: Casual  Grooming:  Grooming: Normal  Cosmetic use:  Cosmetic Use: None  Posture/gait:  Posture/Gait: Slumped  Motor activity:  Motor Activity: Not Remarkable  Sensorium  Attention:  Attention: Normal  Concentration:  Concentration: Normal  Orientation:  Orientation: X5  Recall/memory:  Recall/Memory: Normal  Affect and Mood  Affect:  Affect: Blunted  Mood:  Mood: Anxious  Relating  Eye contact:  Eye Contact: Fleeting  Facial expression:  Facial Expression: Constricted  Attitude toward examiner:  Attitude Toward Examiner: Cooperative  Thought and Language  Speech flow: Speech Flow: Normal  Thought content:     Preoccupation:     Hallucinations:     Organization:     Company secretary of Knowledge:     Intelligence:     Abstraction:     Judgement:  Judgement:  Fair  Reality Testing:     Insight:  Insight: Poor  Decision Making:  Decision Making: Vacilates  Social Functioning  Social Maturity:  Social Maturity: (Has one friend he hangs out with pretty often.  Says he used to have more friends in high school)  Social Judgement:  Social Judgement: Naive  Stress  Stressors:  Stressors: Transitions(Adjusting to college)  Coping  Ability:     Skill Deficits:     Supports:      Family and Psychosocial History: Family history Marital status: Single What is your sexual orientation?: heterosexual Does patient have children?: No  Childhood History:  Childhood History By whom was/is the patient raised?: Both parents Patient's description of current relationship with people who raised him/her: Good relationship with both parents Does patient have siblings?: Yes Number of Siblings: 2 Description of patient's current relationship with siblings: Sister (28) lives in [place]     Twin Brother (19)-lives with patient, they get along most of the time   Did patient suffer any verbal/emotional/physical/sexual abuse as a child?: No Did patient suffer from severe childhood neglect?: No Has patient ever been sexually abused/assaulted/raped as an adolescent or adult?: No Was the patient ever a victim of a crime or a disaster?: No Witnessed domestic violence?: No Has patient been effected by domestic violence as an adult?: No  CCA Part Two B  Employment/Work Situation: Employment / Work Psychologist, occupationalituation Employment situation: Nurse, 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  Education: Engineer, civil (consulting)ducation School Currently Attending: [employer]  Would eventually like to transfer to the [employer] [place]  Did Garment/textile technologistYou Graduate From [employer]?: Yes  Religion:    Leisure/Recreation: Leisure / Recreation Leisure and Hobbies: Likes to draw  Pension scheme managerWatch videos about drawing  Play video games  Go for walks  Exercise/Diet: Exercise/Diet Do You Have Any Trouble Sleeping?: Yes Explanation of Sleeping Difficulties: Takes 1-2 hours to fall asleep  CCA Part Two C  Alcohol/Drug Use: Alcohol / Drug Use History of alcohol / drug use?: No history of alcohol / drug abuse                      CCA Part Three  ASAM's:  Six Dimensions of Multidimensional Assessment  Dimension 1:  Acute Intoxication and/or Withdrawal Potential:     Dimension 2:  Biomedical Conditions and  Complications:     Dimension 3:  Emotional, Behavioral, or Cognitive Conditions and Complications:     Dimension 4:  Readiness to Change:     Dimension 5:  Relapse, Continued use, or Continued Problem Potential:     Dimension 6:  Recovery/Living Environment:      Substance use Disorder (SUD)    Social Function:  Social Functioning Social Maturity: (Has one friend he hangs out with pretty often.  Says he used to have more friends in high school) Social Judgement: Naive  Stress:  Stress Stressors: Transitions(Adjusting to college) Patient Takes Medications The Way The Doctor Instructed?: Yes  Risk Assessment- Self-Harm Potential: Risk Assessment For Self-Harm Potential Thoughts of Self-Harm: No current thoughts Additional Information for Self-Harm Potential: Acts of Self-harm(hits himself)  Risk Assessment -Dangerous to Others Potential: Risk Assessment For Dangerous to Others Potential Method: No Plan  DSM5 Diagnoses: Patient Active Problem List   Diagnosis Date Noted  . Asperger's disorder 02/20/2015  . ADHD (attention deficit hyperactivity disorder), combined type 02/20/2015  . Behavior problems 02/20/2015  . Transient tic disorder 08/30/2013  . Episodic tension type headache 08/30/2013  . Chronic motor or vocal tic  disorder 02/06/2013  . 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  Will call patient with contact information once this therapist determines where to refer him.    Marilu Favre

## 2018-06-02 ENCOUNTER — Telehealth (HOSPITAL_COMMUNITY): Payer: Self-pay | Admitting: Licensed Clinical Social Worker

## 2018-06-02 NOTE — Telephone Encounter (Signed)
Therapist called to provide family with contact information for potential therapists in the area who have some specialization in OCD and/or ASD.  Left a message requesting a return call.

## 2018-06-05 ENCOUNTER — Telehealth (HOSPITAL_COMMUNITY): Payer: Self-pay | Admitting: Licensed Clinical Social Worker

## 2018-06-05 NOTE — Telephone Encounter (Signed)
Pt rmom (josie) returning your call about a referral.  CB # [phone]

## 2018-06-05 NOTE — Telephone Encounter (Signed)
Spoke with patient's mother, Lianne CureJosie and provided her with contact information for potential therapists in the area who have some specialization in autism and/or OCD. These included: 1.  [facility] [address] [place], [place] [address] [phone]  2.  [facility] [address] [place], [place] [phone]  3.  Dr. Lonn GeorgiaElinor Call [address] [place], [place] [address] [phone]  4.  Arna MediciKristin Grapes [address] [place], [place] [phone]

## 2018-07-05 ENCOUNTER — Other Ambulatory Visit (INDEPENDENT_AMBULATORY_CARE_PROVIDER_SITE_OTHER): Payer: Self-pay | Admitting: Neurology

## 2019-10-08 ENCOUNTER — Ambulatory Visit
Admission: EM | Admit: 2019-10-08 | Discharge: 2019-10-08 | Disposition: A | Discharge status: Home / Self Care | Payer: Self-pay

## 2019-10-27 IMAGING — CT CT RENAL STONE PROTOCOL
2 of 4 series · 16 of 46 positions shown, 18 images · non-contrast
Comparison: Abdominal radiograph performed 04/13/2005

CLINICAL DATA: Acute onset of right lower quadrant abdominal pain.

EXAM:
CT ABDOMEN AND PELVIS WITHOUT CONTRAST
TECHNIQUE: Multidetector CT imaging of the abdomen and pelvis was performed
following the standard protocol without IV contrast.

[Series 2: axial st · axial · 0.88mm/px · z∈[+746,+1271]mm · 13 of 115 slices shown, 15 images]
[im 5/115  soft-tissue]
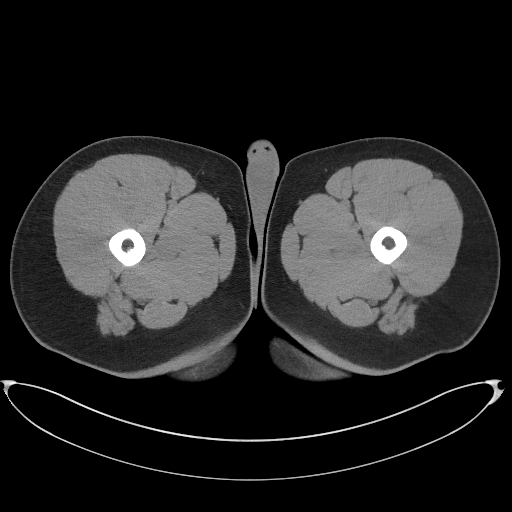
[im 5/115  bone]
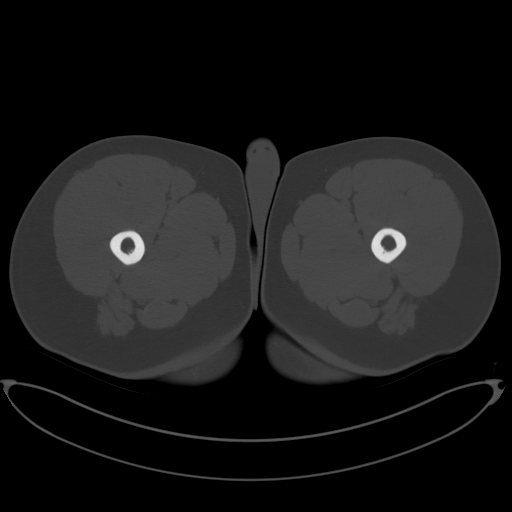
[im 15/115  soft-tissue]
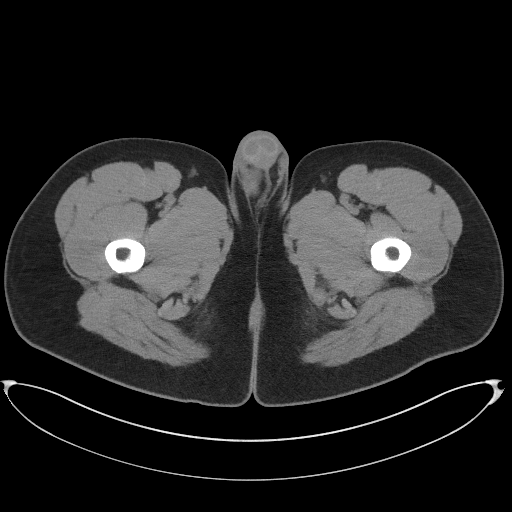
[im 24/115  soft-tissue]
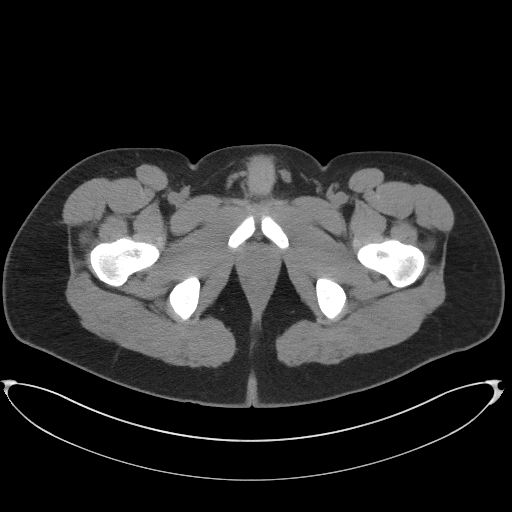
[im 34/115  soft-tissue]
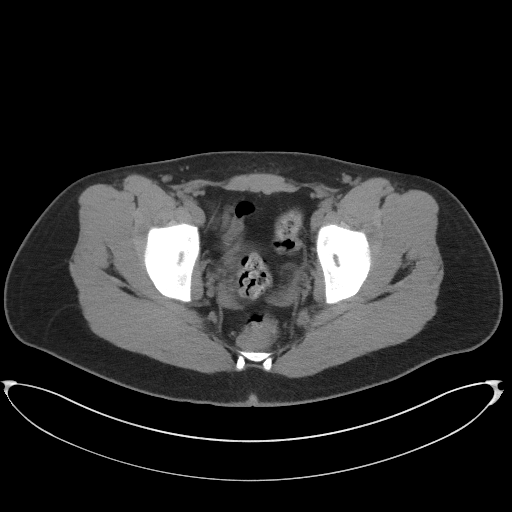
[im 39/115  soft-tissue]
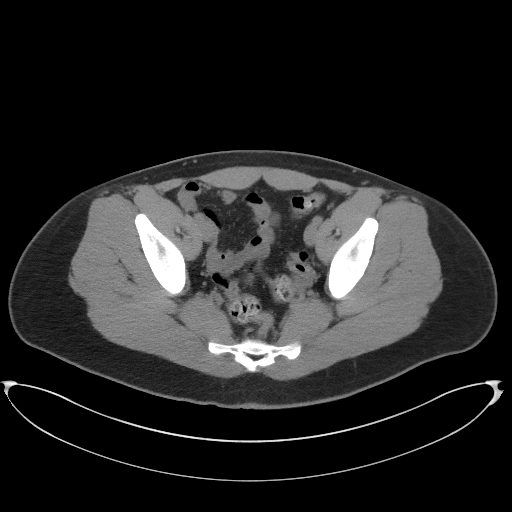
[im 48/115  soft-tissue]
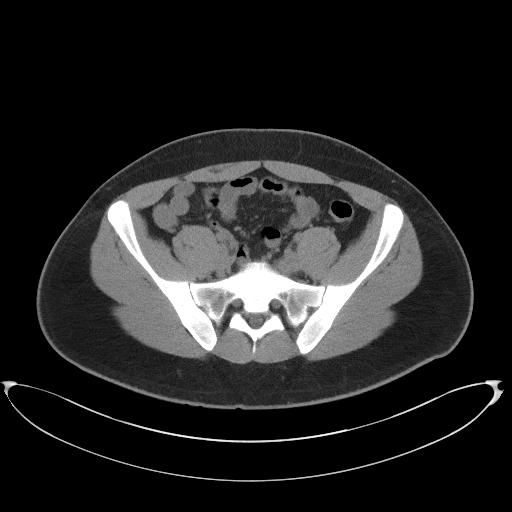
[im 58/115  soft-tissue]
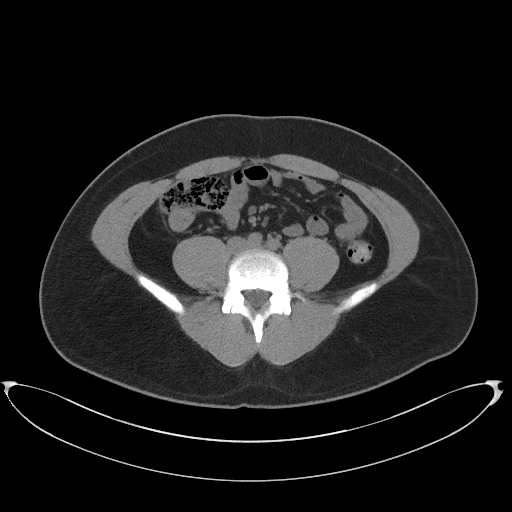
[im 67/115  soft-tissue]
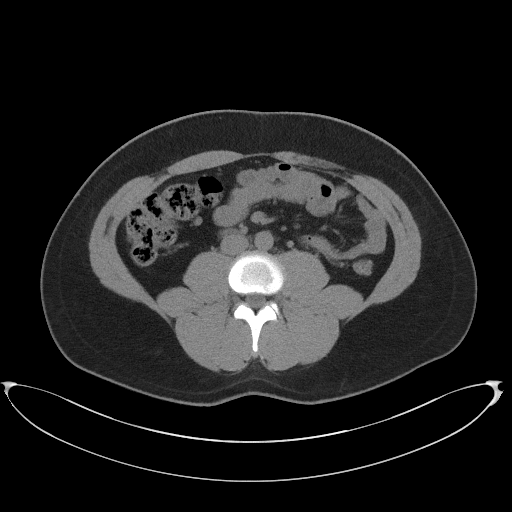
[im 77/115  soft-tissue]
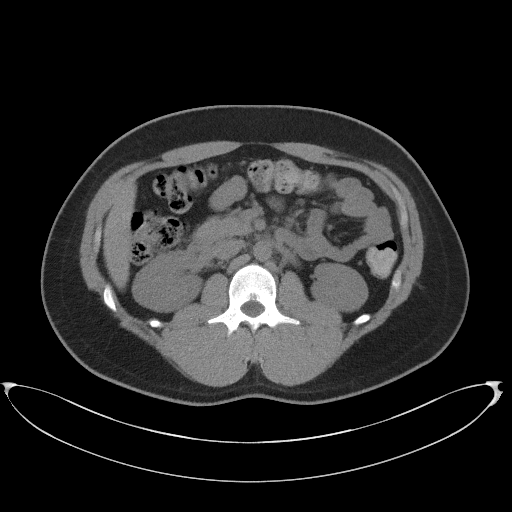
[im 77/115  bone]
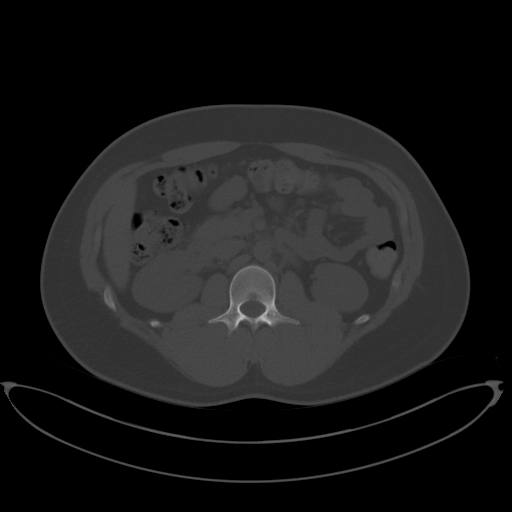
[im 81/115  soft-tissue]
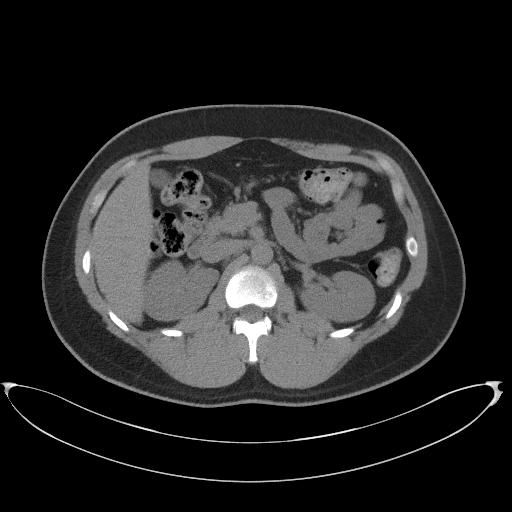
[im 91/115  soft-tissue]
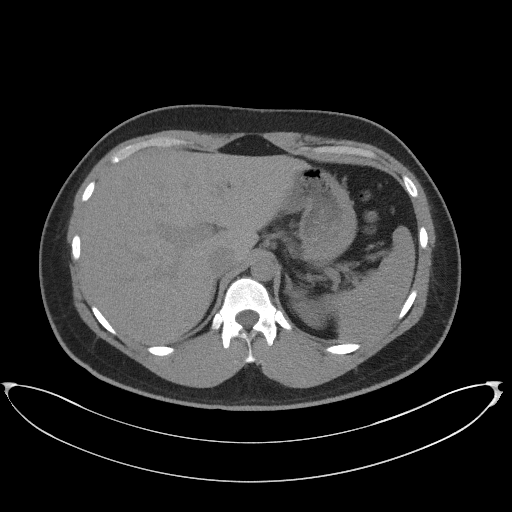
[im 100/115  soft-tissue]
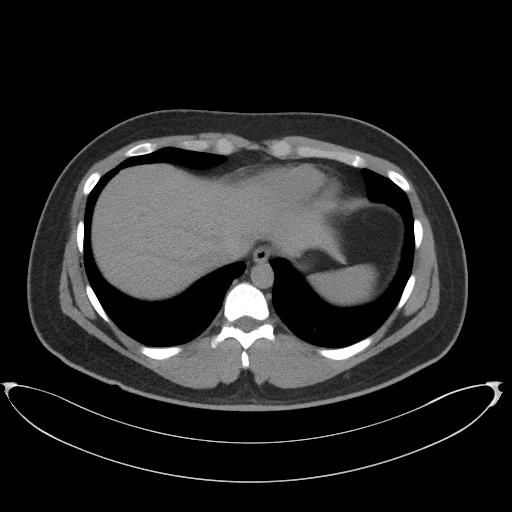
[im 110/115  soft-tissue]
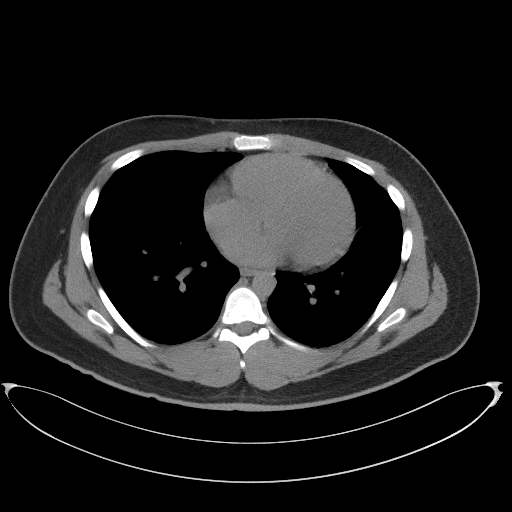

[Series 5: coronal st · coronal · 0.83mm/px · 3 of 93 slices shown]
[im 31/93  soft-tissue]
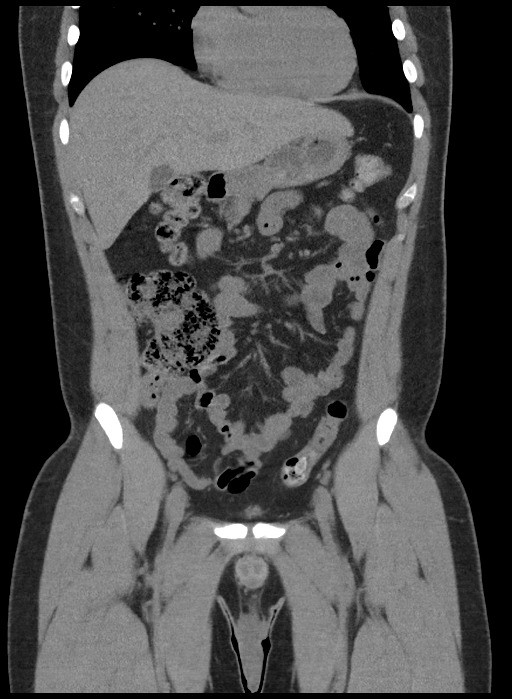
[im 41/93  soft-tissue]
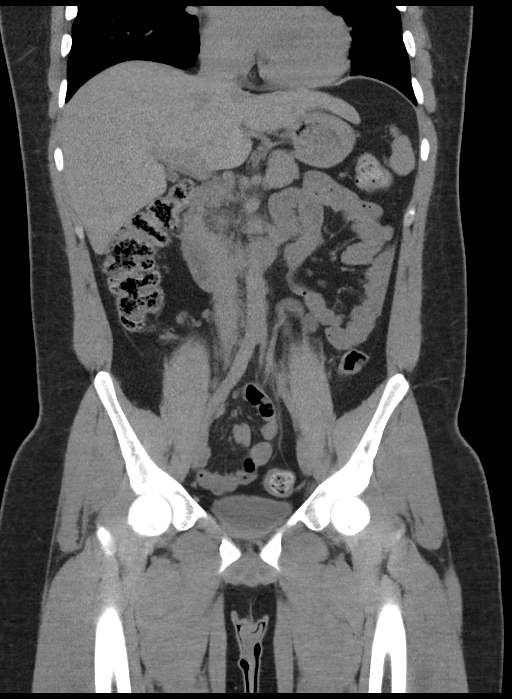
[im 52/93  soft-tissue]
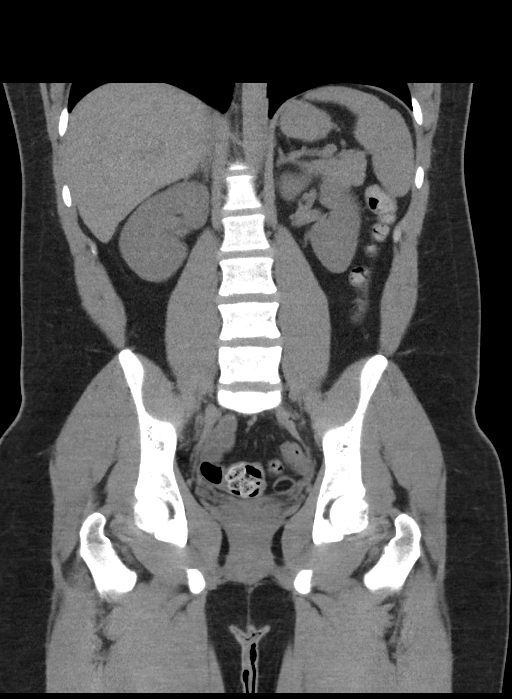

[16 of 46 positions shown; findings below may reference images not displayed]

FINDINGS: Lower chest: The visualized lung bases are grossly clear. The
visualized portions of the mediastinum are unremarkable.

Hepatobiliary: The liver is unremarkable in appearance. The
gallbladder is unremarkable in appearance. The common bile duct
remains normal in caliber.

Pancreas: The pancreas is within normal limits.

Spleen: The spleen is unremarkable in appearance.

Adrenals/Urinary Tract: The adrenal glands are unremarkable in
appearance. The kidneys are within normal limits. There is no
evidence of hydronephrosis. No renal or ureteral stones are
identified. No perinephric stranding is seen.

Stomach/Bowel: The stomach is unremarkable in appearance. The small
bowel is within normal limits. The appendix is normal in caliber,
without evidence of appendicitis. The colon is unremarkable in
appearance.

Vascular/Lymphatic: The abdominal aorta is unremarkable in
appearance. The inferior vena cava is grossly unremarkable. No
retroperitoneal lymphadenopathy is seen. No pelvic sidewall
lymphadenopathy is identified.

Reproductive: The bladder is mildly distended and grossly
unremarkable. The prostate remains normal in size.

Other: No additional soft tissue abnormalities are seen.

Musculoskeletal: No acute osseous abnormalities are identified. The
visualized musculature is unremarkable in appearance.
IMPRESSION: Unremarkable noncontrast CT of the abdomen and pelvis.

## 2019-11-14 ENCOUNTER — Ambulatory Visit: Payer: HRSA Program | Attending: Internal Medicine

## 2019-11-14 DIAGNOSIS — Z20822 Contact with and (suspected) exposure to covid-19: Secondary | ICD-10-CM

## 2019-11-14 DIAGNOSIS — Z20828 Contact with and (suspected) exposure to other viral communicable diseases: Secondary | ICD-10-CM | POA: Insufficient documentation

## 2019-11-15 LAB — SPECIMEN STATUS REPORT

## 2019-11-15 LAB — NOVEL CORONAVIRUS, NAA: SARS-CoV-2, NAA: NOT DETECTED

## 2019-11-21 ENCOUNTER — Ambulatory Visit: Payer: Self-pay | Attending: Internal Medicine

## 2019-11-21 DIAGNOSIS — Z20822 Contact with and (suspected) exposure to covid-19: Secondary | ICD-10-CM

## 2019-11-21 DIAGNOSIS — Z20828 Contact with and (suspected) exposure to other viral communicable diseases: Secondary | ICD-10-CM | POA: Insufficient documentation

## 2019-11-23 LAB — NOVEL CORONAVIRUS, NAA: SARS-CoV-2, NAA: NOT DETECTED

## 2020-03-06 ENCOUNTER — Ambulatory Visit
Admission: EM | Admit: 2020-03-06 | Discharge: 2020-03-06 | Disposition: A | Discharge status: Home / Self Care | Payer: Self-pay

## 2020-03-06 ENCOUNTER — Encounter: Payer: Self-pay | Admitting: Emergency Medicine

## 2020-03-06 ENCOUNTER — Other Ambulatory Visit: Payer: Self-pay

## 2020-03-06 DIAGNOSIS — Z20822 Contact with and (suspected) exposure to covid-19: Secondary | ICD-10-CM

## 2020-03-06 DIAGNOSIS — R0982 Postnasal drip: Secondary | ICD-10-CM

## 2020-03-06 DIAGNOSIS — J3489 Other specified disorders of nose and nasal sinuses: Secondary | ICD-10-CM

## 2020-03-06 DIAGNOSIS — R0981 Nasal congestion: Secondary | ICD-10-CM

## 2020-03-06 DIAGNOSIS — R067 Sneezing: Secondary | ICD-10-CM

## 2020-03-06 MED ORDER — PREDNISONE 50 MG PO TABS
50.0000 mg | ORAL_TABLET | Freq: Every day | ORAL | 0 refills | Status: AC
Start: 2020-03-06 — End: ?

## 2020-03-06 MED ORDER — AZELASTINE HCL 0.1 % NA SOLN: 2.0000 | Freq: Two times a day (BID) | NASAL | 0 refills | Status: AC

## 2020-03-06 MED ORDER — FLUTICASONE PROPIONATE 50 MCG/ACT NA SUSP: 2.0000 | Freq: Every day | NASAL | 0 refills | Status: AC

## 2020-03-06 MED ORDER — MONTELUKAST SODIUM 10 MG PO TABS: 10.0000 mg | ORAL_TABLET | Freq: Every day | ORAL | 0 refills | Status: AC

## 2020-03-06 NOTE — Discharge Instructions (Signed)
COVID PCR testing ordered. I would like you to quarantine until testing results.  Start prednisone as directed.  Start Flonase and azelastine as directed.  I have refilled your Singulair, you can start at night on top of your Xyzal.  Tylenol/motrin for pain and fever. Keep hydrated, urine should be clear to pale yellow in color. If experiencing shortness of breath, trouble breathing, go to the emergency department for further evaluation needed.

## 2020-03-06 NOTE — ED Triage Notes (Signed)
Sinus congestion, runny nose, constantly having to blow nose.  Reports allergy medicine is not helping, headaches, body aches

## 2020-03-06 NOTE — ED Provider Notes (Signed)
[facility]    CSN: [id number] Arrival date & time: 03/06/20  1611      History   Chief Complaint Chief Complaint  Patient presents with  . URI    HPI [facility] Adam Robbins. is a 24 y.o. male.   24 year old male comes in for 2.5 week history of URI symptoms. Has had sinus pressure, nasal congestion, rhinorrhea, headaches, body aches. Denies fever, chills. Denies abdominal pain, nausea, vomiting. Has had diarrhea, but as a result of drinking milk with history of lactose intolerant. Denies shortness of breath, loss of taste/smell.      Past Medical History:  Diagnosis Date  . Asperger's disorder 10/21/10  . Autism   . 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 Asperger's disorder 02/20/2015  . ADHD (attention deficit hyperactivity disorder), combined type 02/20/2015  . Behavior problems 02/20/2015  . Transient tic disorder 08/30/2013  . Episodic tension type headache 08/30/2013  . 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 TOENAIL EXCISION  2013  . TONSILLECTOMY  2003       Home Medications    Prior to Admission medications   Medication Sig Start Date End Date Taking? Authorizing Provider  sertraline (ZOLOFT) 50 MG tablet Take 50 mg by mouth daily.   Yes [provider]  azelastine (ASTELIN) 0.1 % nasal spray Place 2 sprays into both nostrils 2 (two) times daily. 03/06/20   Cathie Hoops, Brenon Antosh V, PA-C  fluticasone (FLONASE) 50 MCG/ACT nasal spray Place 2 sprays into both nostrils daily. 03/06/20   Cathie Hoops, Keston Seever V, PA-C  levocetirizine (XYZAL) 5 MG tablet Take 5 mg by mouth as needed.     [provider]  montelukast (SINGULAIR) 10 MG tablet Take 1 tablet (10 mg total) by mouth at bedtime. 03/06/20   Cathie Hoops, Carole Deere V, PA-C  predniSONE (DELTASONE) 50  MG tablet Take 1 tablet (50 mg total) by mouth daily with breakfast. 03/06/20   Cathie Hoops, Ariv Penrod V, PA-C  cloNIDine (CATAPRES) 0.1 MG tablet Take 1 tablet (0.1 mg total) by mouth 2 (two) times daily. 04/29/17 03/06/20  Keturah Shavers, MD  guanFACINE (INTUNIV) 2 MG TB24 ER tablet TAKE 1 TABLET BY MOUTH 2 HOURS BEFORE BEDTIME 07/06/18 03/06/20  Keturah Shavers, MD  pantoprazole (PROTONIX) 40 MG tablet Take 40 mg by mouth every morning. 05/04/18 03/06/20  [provider]    Family History Family History  Problem Relation Age of Onset  . Diabetes Mother   . Hyperlipidemia Mother   . Graves' disease Mother   . Migraines Mother   . Diabetes Father   . Diabetes Maternal Grandmother   . Diabetes Maternal Grandfather   . Hyperlipidemia Maternal Grandfather   . Diabetes Paternal Grandfather   . ADD / ADHD Sister   . ADD / ADHD Brother   . Diabetes Paternal Aunt   . Diabetes Paternal Aunt   . Other Cousin        Tourette's Syndrome    Social History Social History   Tobacco Use  . Smoking status: Never Smoker  . Smokeless tobacco: Never Used  Substance Use Topics  . Alcohol use: No  . Drug use: No     Allergies   Other   Review of Systems Review of Systems  Reason unable to perform ROS:  See HPI as above.     Physical Exam Triage Vital Signs ED Triage Vitals [03/06/20 1729]  Enc Vitals Group     BP      Pulse      Resp      Temp      Temp src      SpO2      Weight      Height      Head Circumference      Peak Flow      Pain Score 6     Pain Loc      Pain Edu?      Excl. in GC?    No data found.  Updated Vital Signs BP (!) 104/58 (BP Location: Right Arm)   Pulse 98   Temp 98.3 F (36.8 C) (Oral)   Resp 18   SpO2 96%   Physical Exam Constitutional:      General: Adam Robbins is not in acute distress.    Appearance: Normal appearance. Adam Robbins is not ill-appearing, toxic-appearing or diaphoretic.  HENT:     Head: Normocephalic and atraumatic.     Nose: Congestion present.      Right Sinus: No maxillary sinus tenderness or frontal sinus tenderness.     Left Sinus: No maxillary sinus tenderness or frontal sinus tenderness.     Mouth/Throat:     Mouth: Mucous membranes are moist.     Pharynx: Oropharynx is clear. Uvula midline.  Cardiovascular:     Rate and Rhythm: Normal rate and regular rhythm.     Heart sounds: Normal heart sounds. No murmur. No friction rub. No gallop.   Pulmonary:     Effort: Pulmonary effort is normal. No accessory muscle usage, prolonged expiration, respiratory distress or retractions.     Comments: Lungs clear to auscultation without adventitious lung sounds. Musculoskeletal:     Cervical back: Normal range of motion and neck supple.  Neurological:     General: No focal deficit present.     Mental Status: Adam Robbins is alert and oriented to person, place, and time.      UC Treatments / Results  Labs (all labs ordered are listed, but only abnormal results are displayed) Labs Reviewed  NOVEL CORONAVIRUS, NAA    EKG   Radiology No results found.  Procedures Procedures (including critical care time)  Medications Ordered in UC Medications - No data to display  Initial Impression / Assessment and Plan / UC Course  I have reviewed the triage vital signs and the nursing notes.  Pertinent labs & imaging results that were available during my care of the patient were reviewed by me and considered in my medical decision making (see chart for details).    COVID PCR test ordered. Patient to quarantine until testing results return. No alarming signs on exam.  Patient speaking in full sentences without respiratory distress.  Symptomatic treatment discussed.  Push fluids.  Return precautions given.  Patient expresses understanding and agrees to plan.   Final Clinical Impressions(s) / UC Diagnoses   Final diagnoses:  Sinus pressure  Nasal congestion  Post-nasal drip  Sneezing   ED Prescriptions    Medication Sig Dispense Auth.  Provider   montelukast (SINGULAIR) 10 MG tablet Take 1 tablet (10 mg total) by mouth at bedtime. 30 tablet Paisley Grajeda V, PA-C   predniSONE (DELTASONE) 50 MG tablet Take 1 tablet (50 mg total) by mouth daily with breakfast. 5 tablet Ashya Nicolaisen V, PA-C   azelastine (ASTELIN) 0.1 %  nasal spray Place 2 sprays into both nostrils 2 (two) times daily. 30 mL Katalena Malveaux V, PA-C   fluticasone (FLONASE) 50 MCG/ACT nasal spray Place 2 sprays into both nostrils daily. 1 g Ok Edwards, PA-C     PDMP not reviewed this encounter.   Ok Edwards, PA-C 03/06/20 1857

## 2020-03-08 LAB — NOVEL CORONAVIRUS, NAA: SARS-CoV-2, NAA: NOT DETECTED

## 2020-03-08 LAB — SARS-COV-2, NAA 2 DAY TAT

## 2021-03-26 ENCOUNTER — Encounter (INDEPENDENT_AMBULATORY_CARE_PROVIDER_SITE_OTHER): Payer: Self-pay

## 2023-12-19 ENCOUNTER — Ambulatory Visit
Admission: EM | Admit: 2023-12-19 | Discharge: 2023-12-19 | Disposition: A | Discharge status: Home / Self Care | Payer: MEDICAID | Attending: Family Medicine | Admitting: Family Medicine

## 2023-12-19 DIAGNOSIS — B349 Viral infection, unspecified: Secondary | ICD-10-CM | POA: Diagnosis not present

## 2023-12-19 DIAGNOSIS — Z20828 Contact with and (suspected) exposure to other viral communicable diseases: Secondary | ICD-10-CM | POA: Diagnosis not present

## 2023-12-19 MED ORDER — IBUPROFEN 600 MG PO TABS: 600.0000 mg | ORAL_TABLET | Freq: Four times a day (QID) | ORAL | 0 refills | Status: AC | PRN

## 2023-12-19 MED ORDER — ACETAMINOPHEN 325 MG PO TABS: 650.0000 mg | ORAL_TABLET | Freq: Four times a day (QID) | ORAL | 0 refills | Status: AC | PRN

## 2023-12-19 MED ORDER — PROMETHAZINE-DM 6.25-15 MG/5ML PO SYRP: 5.0000 mL | ORAL_SOLUTION | Freq: Three times a day (TID) | ORAL | 0 refills | Status: AC | PRN

## 2023-12-19 MED ORDER — CETIRIZINE HCL 10 MG PO TABS: 10.0000 mg | ORAL_TABLET | Freq: Every day | ORAL | 0 refills | Status: AC

## 2023-12-19 MED ORDER — OSELTAMIVIR PHOSPHATE 75 MG PO CAPS: 75.0000 mg | ORAL_CAPSULE | Freq: Two times a day (BID) | ORAL | 0 refills | Status: AC

## 2023-12-19 NOTE — ED Provider Notes (Signed)
[facility]  Note:  This document was prepared using Conservation officer, historic buildings and may include unintentional dictation errors.  MRN: [id number] DOB: 25-Dec-1995  Subjective:   Adam Robbins. is a 28 y.o. male presenting for 3-day history of coughing, congestion, body pains.  Had very close exposure to influenza with his father.  Has used over-the-counter medications with minimal relief.  No history of asthma or respiratory disorders.  No current facility-administered medications for this encounter.  Current Outpatient Medications:    azelastine (ASTELIN) 0.1 % nasal spray, Place 2 sprays into both nostrils 2 (two) times daily., Disp: 30 mL, Rfl: 0   fluticasone (FLONASE) 50 MCG/ACT nasal spray, Place 2 sprays into both nostrils daily., Disp: 1 g, Rfl: 0   levocetirizine (XYZAL) 5 MG tablet, Take 5 mg by mouth as needed. , Disp: , Rfl:    montelukast (SINGULAIR) 10 MG tablet, Take 1 tablet (10 mg total) by mouth at bedtime., Disp: 30 tablet, Rfl: 0   predniSONE (DELTASONE) 50 MG tablet, Take 1 tablet (50 mg total) by mouth daily with breakfast., Disp: 5 tablet, Rfl: 0   sertraline (ZOLOFT) 50 MG tablet, Take 50 mg by mouth daily., Disp: , Rfl:    Allergies  Allergen Reactions   Other     Seasonal    Past Medical History:  Diagnosis Date   Asperger's disorder 10/21/10   Autism    Unspecified conductive hearing loss 12/08/10     Past Surgical History:  Procedure Laterality Date   TOENAIL EXCISION  2013   TONSILLECTOMY  2003    Family History  Problem Relation Age of Onset   Diabetes Mother    Hyperlipidemia Mother    Luiz Blare' disease Mother    Migraines Mother    Diabetes Father    Diabetes Maternal Grandmother    Diabetes Maternal Grandfather    Hyperlipidemia Maternal Grandfather    Diabetes Paternal Grandfather    ADD / ADHD Sister    ADD / ADHD Brother    Diabetes Paternal Aunt    Diabetes Paternal Aunt    Other Cousin         Tourette's Syndrome    Social History   Tobacco Use   Smoking status: Never   Smokeless tobacco: Never  Substance Use Topics   Alcohol use: No   Drug use: No    ROS   Objective:   Vitals: BP 116/79 (BP Location: Right Arm)   Pulse (!) 117   Temp 99.4 F (37.4 C) (Oral)   Resp 18   SpO2 96%   Physical Exam Constitutional:      General: He is not in acute distress.    Appearance: Normal appearance. He is well-developed and normal weight. He is not ill-appearing, toxic-appearing or diaphoretic.  HENT:     Head: Normocephalic and atraumatic.     Right Ear: Tympanic membrane, ear canal and external ear normal. No drainage, swelling or tenderness. No middle ear effusion. There is no impacted cerumen. Tympanic membrane is not erythematous or bulging.     Left Ear: Tympanic membrane, ear canal and external ear normal. No drainage, swelling or tenderness.  No middle ear effusion. There is no impacted cerumen. Tympanic membrane is not erythematous or bulging.     Nose: Nose normal. No congestion or rhinorrhea.     Mouth/Throat:     Mouth: Mucous membranes are moist.     Pharynx: No oropharyngeal exudate or posterior oropharyngeal erythema.  Eyes:     General: No scleral icterus.       Right eye: No discharge.        Left eye: No discharge.     Extraocular Movements: Extraocular movements intact.     Conjunctiva/sclera: Conjunctivae normal.  Cardiovascular:     Rate and Rhythm: Normal rate and regular rhythm.     Heart sounds: Normal heart sounds. No murmur heard.    No friction rub. No gallop.  Pulmonary:     Effort: Pulmonary effort is normal. No respiratory distress.     Breath sounds: Normal breath sounds. No stridor. No wheezing, rhonchi or rales.  Musculoskeletal:     Cervical back: Normal range of motion and neck supple. No rigidity. No muscular tenderness.  Neurological:     General: No focal deficit present.     Mental Status: He is alert and oriented to  person, place, and time.  Psychiatric:        Mood and Affect: Mood normal.        Behavior: Behavior normal.        Thought Content: Thought content normal.     Assessment and Plan :   PDMP not reviewed this encounter.  1. Acute viral syndrome   2. Exposure to influenza    Patient's mother declined testing and I am in agreement.  Will cover for influenza with Tamiflu given exposure, symptom set, current incidence in the community.  Use supportive care, rest, fluids, hydration, light meals, schedule Tylenol and ibuprofen. Counseled patient on potential for adverse effects with medications prescribed today, patient verbalized understanding. ER and return-to-clinic precautions discussed, patient verbalized understanding.    Wallis Bamberg, [place] 12/20/23 1123

## 2023-12-19 NOTE — ED Triage Notes (Signed)
Pt reports cough and congestion x  2-3 DAYS. Per mother, pt father tested positive for flu yesterday. Pt taking otc cold meds.

## 2023-12-19 NOTE — Discharge Instructions (Signed)
Start Tamiflu for suspect flu given your exposure. We will manage this as a viral illness. For sore throat or cough try using a honey-based tea. Use 3 teaspoons of honey with juice squeezed from half lemon. Place shaved pieces of ginger into 1/2-1 cup of water and warm over stove top. Then mix the ingredients and repeat every 4 hours as needed. Please take ibuprofen 600mg  every 6 hours with food alternating with OR taken together with Tylenol 500mg -650mg  every 6 hours for throat pain, fevers, aches and pains. Hydrate very well with at least 2 liters of water. Eat light meals such as soups (chicken and noodles, vegetable, chicken and wild rice).  Do not eat foods that you are allergic to.  Keep taking Xyzal which can help against postnasal drainage, sinus congestion which can cause sinus pain, sinus headaches, throat pain, painful swallowing, coughing.  You can take this together with cough syrup.

## 2023-12-22 ENCOUNTER — Inpatient Hospital Stay (INDEPENDENT_AMBULATORY_CARE_PROVIDER_SITE_OTHER)
Admission: AD | Admit: 9998-11-21 | Discharge: 9998-11-21 | DRG: 999 | Disposition: A | Discharge status: Still In Hospital | Payer: PRIVATE HEALTH INSURANCE | Source: Other Acute Inpatient Hospital | Attending: Family Medicine | Admitting: *Deleted

## 2024-04-18 ENCOUNTER — Encounter (HOSPITAL_BASED_OUTPATIENT_CLINIC_OR_DEPARTMENT_OTHER): Payer: Self-pay

## 2024-04-18 ENCOUNTER — Emergency Department (HOSPITAL_BASED_OUTPATIENT_CLINIC_OR_DEPARTMENT_OTHER)
Admission: EM | Admit: 2024-04-18 | Discharge: 2024-04-19 | Discharge status: Against Medical Advice | Payer: MEDICAID | Attending: Emergency Medicine | Admitting: Emergency Medicine

## 2024-04-18 ENCOUNTER — Other Ambulatory Visit: Payer: Self-pay

## 2024-04-18 DIAGNOSIS — R42 Dizziness and giddiness: Secondary | ICD-10-CM | POA: Insufficient documentation

## 2024-04-18 DIAGNOSIS — R000 Tachycardia, unspecified: Secondary | ICD-10-CM | POA: Insufficient documentation

## 2024-04-18 DIAGNOSIS — E119 Type 2 diabetes mellitus without complications: Secondary | ICD-10-CM | POA: Diagnosis not present

## 2024-04-18 DIAGNOSIS — Z5321 Procedure and treatment not carried out due to patient leaving prior to being seen by health care provider: Secondary | ICD-10-CM | POA: Diagnosis not present

## 2024-04-18 DIAGNOSIS — F840 Autistic disorder: Secondary | ICD-10-CM | POA: Insufficient documentation

## 2024-04-18 LAB — URINALYSIS, ROUTINE W REFLEX MICROSCOPIC
Bilirubin Urine: NEGATIVE
Glucose, UA: NEGATIVE mg/dL
Hgb urine dipstick: NEGATIVE
Ketones, ur: NEGATIVE mg/dL
Leukocytes,Ua: NEGATIVE
Nitrite: NEGATIVE
Protein, ur: NEGATIVE mg/dL
Specific Gravity, Urine: 1.015 (ref 1.005–1.030)
pH: 5.5 (ref 5.0–8.0)

## 2024-04-18 NOTE — ED Triage Notes (Signed)
 Pt. Reports feeling dizziness, legs shaking, heart racing and legs almost gave out a couple hours ago. Pt. Mother states pt. Was incoherent when speaking to her. Hx of diabetes, autism

## 2024-04-27 ENCOUNTER — Inpatient Hospital Stay (INDEPENDENT_AMBULATORY_CARE_PROVIDER_SITE_OTHER)
Admission: AD | Admit: 9998-11-21 | Discharge: 9998-11-21 | DRG: 988 | Disposition: A | Discharge status: Still In Hospital | Payer: 59 | Source: Intra-hospital | Attending: Family Medicine | Admitting: *Deleted
